# Patient Record
Sex: Female | Born: 2000 | Race: White | Hispanic: No | Marital: Single | Smoking: Never smoker
Health system: Southern US, Community
[De-identification: ages and names within clinical notes are randomized; demographics above are authoritative.]

## PROBLEM LIST (undated history)

## (undated) HISTORY — PX: NO PAST SURGERIES: SHX2092

---
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
## 2023-12-10 ENCOUNTER — Ambulatory Visit
Admission: EM | Admit: 2023-12-10 | Discharge: 2023-12-10 | Disposition: A | Discharge status: Home / Self Care | Payer: BC Managed Care – PPO | Attending: Family Medicine | Admitting: Family Medicine

## 2023-12-10 DIAGNOSIS — J101 Influenza due to other identified influenza virus with other respiratory manifestations: Secondary | ICD-10-CM | POA: Insufficient documentation

## 2023-12-10 LAB — GROUP A STREP BY PCR: Group A Strep by PCR: NOT DETECTED

## 2023-12-10 LAB — RESP PANEL BY RT-PCR (FLU A&B, COVID) ARPGX2
Influenza A by PCR: POSITIVE — AB
Influenza B by PCR: NEGATIVE
SARS Coronavirus 2 by RT PCR: NEGATIVE

## 2023-12-10 MED ORDER — PROMETHAZINE-DM 6.25-15 MG/5ML PO SYRP: 5.0000 mL | ORAL_SOLUTION | Freq: Four times a day (QID) | ORAL | 0 refills | Status: AC | PRN

## 2023-12-10 MED ORDER — OSELTAMIVIR PHOSPHATE 75 MG PO CAPS: 75.0000 mg | ORAL_CAPSULE | Freq: Two times a day (BID) | ORAL | 0 refills | Status: AC

## 2023-12-10 NOTE — ED Triage Notes (Signed)
Pt c/o cough,sore throat,bodyaches & chills x2 days. Has tried advil w/o relief.

## 2023-12-10 NOTE — ED Provider Notes (Signed)
[facility]    CSN: [id number] Arrival date & time: 12/10/23  1335      History   Chief Complaint Chief Complaint  Patient presents with   Cough   Sore Throat   Generalized Body Aches   Chills    HPI Tonya Orr is a 23 y.o. female.   HPI  History obtained from the patient and her dad . Tonya Orr presents for fever, body aches, sore throat, headache, chills, watery eyes and sneezing that started 2 days ago.  No abdominal pain, vomiting or diarrhea. Took some Advil prior to arrival.  No known sick contacts. Dad says he and her brother are well.  He believes she picked it up at her college.       History reviewed. No pertinent past medical history.  There are no active problems to display for this patient.   Past Surgical History:  Procedure Laterality Date   NO PAST SURGERIES     NO PAST SURGERIES      OB History   No obstetric history on file.      Home Medications    Prior to Admission medications   Medication Sig Start Date End Date Taking? Authorizing Provider  oseltamivir (TAMIFLU) 75 MG capsule Take 1 capsule (75 mg total) by mouth every 12 (twelve) hours. 12/10/23  Yes Theda Payer, DO  promethazine-dextromethorphan (PROMETHAZINE-DM) 6.25-15 MG/5ML syrup Take 5 mLs by mouth 4 (four) times daily as needed. 12/10/23  Yes Sujay Grundman, DO  sertraline (ZOLOFT) 50 MG tablet Take 50 mg by mouth daily. 10/22/23  Yes [provider]    Family History History reviewed. No pertinent family history.  Social History Social History   Tobacco Use   Smoking status: Never   Smokeless tobacco: Never  Vaping Use   Vaping status: Never Used  Substance Use Topics   Alcohol use: Never   Drug use: Never     Allergies   Penicillins and Sulfa antibiotics   Review of Systems Review of Systems: negative unless otherwise stated in HPI.      Physical Exam Triage Vital Signs ED Triage Vitals  Encounter Vitals Group     BP  12/10/23 1430 117/78     Systolic BP Percentile --      Diastolic BP Percentile --      Pulse Rate 12/10/23 1430 (!) 101     Resp 12/10/23 1430 16     Temp 12/10/23 1430 99.9 F (37.7 C)     Temp Source 12/10/23 1430 Oral     SpO2 12/10/23 1430 98 %     Weight --      Height 12/10/23 1428 5\' 4"  (1.626 m)     Head Circumference --      Peak Flow --      Pain Score 12/10/23 1436 7     Pain Loc --      Pain Education --      Exclude from Growth Chart --    No data found.  Updated Vital Signs BP 117/78 (BP Location: Right Arm)   Pulse (!) 101   Temp 99.9 F (37.7 C) (Oral)   Resp 16   Ht 5\' 4"  (1.626 m)   LMP 11/25/2023 (Exact Date)   SpO2 98%   BMI 20.60 kg/m   Visual Acuity Right Eye Distance:   Left Eye Distance:   Bilateral Distance:    Right Eye Near:   Left Eye Near:    Bilateral Near:  Physical Exam GEN:     alert, non-toxic appearing female in no distress    HENT:  mucus membranes moist, oropharyngeal without lesions or erythema, no tonsillar hypertrophy or exudates, clear nasal discharge, bilateral TM normal EYES:   pupils equal and reactive, no scleral injection or discharge NECK:  normal ROM, no lymphadenopathy, no meningismus   RESP:  no increased work of breathing, clear to auscultation bilaterally CVS:   regular rhythm, tachycardic  Skin:   warm and dry, no rash on visible skin    UC Treatments / Results  Labs (all labs ordered are listed, but only abnormal results are displayed) Labs Reviewed  RESP PANEL BY RT-PCR (FLU A&B, COVID) ARPGX2 - Abnormal; Notable for the following components:      Result Value   Influenza A by PCR POSITIVE (*)    All other components within normal limits  GROUP A STREP BY PCR    EKG   Radiology No results found.  Procedures Procedures (including critical care time)  Medications Ordered in UC Medications - No data to display  Initial Impression / Assessment and Plan / UC Course  I have reviewed the  triage vital signs and the nursing notes.  Pertinent labs & imaging results that were available during my care of the patient were reviewed by me and considered in my medical decision making (see chart for details).       Pt is a 23 y.o. female who presents for 2 days of respiratory symptoms. Dawnelle has an elevated temperature here of 99.9 F and is tachycardic.  Satting well on room air. Overall pt is non-toxic appearing, well hydrated, without respiratory distress. Pulmonary exam is unremarkable.  COVID and influenza panel obtained and was influenza A positive.  Risk and benefits of Tamiflu discussed and she would like [place] to proceed with prescription.  Tamiflu and Promethazine DM prescribed.  Discussed symptomatic treatment. Typical duration of symptoms discussed.  School and work note provided.  Return and ED precautions given and voiced understanding. Discussed MDM, treatment plan and plan for follow-up with patient and her dad who agree with plan.     Final Clinical Impressions(s) / UC Diagnoses   Final diagnoses:  Influenza A     Discharge Instructions      You have influenza A.  Tamiflu was prescribed.  Your symptoms will gradually improve over the next 7 to 10 days.  The cough may last about 3 weeks.   Take ibuprofen 400 mg with Tylenol 1000 mg for fever, headache or body aches.   For cough:  Stop by the the pharmacy to pick up your prescription cough medication. You can use a humidifier for chest congestion and cough.  If you don't have a humidifier, you can sit in the bathroom with the hot shower running.      For sore throat: try warm salt water gargles, Mucinex sore throat cough drops or cepacol lozenges, throat spray, warm tea or water with lemon/honey, popsicles or ice, or OTC cold relief medicine for throat discomfort. You can also purchase chloraseptic spray at the pharmacy or dollar store.   For congestion: take a daily anti-histamine like Zyrtec, Claritin, and a  oral decongestant, such as pseudoephedrine.  You can also use Flonase 1-2 sprays in each nostril daily. Afrin is also a good option, if you do not have high blood pressure.    It is important to stay hydrated: drink plenty of fluids (water, gatorade/powerade/pedialyte, juices, or teas) to keep your  throat moisturized and help further relieve irritation/discomfort.    Return or go to the Emergency Department if symptoms worsen or do not improve in the next few days     ED Prescriptions     Medication Sig Dispense Auth. Provider   oseltamivir (TAMIFLU) 75 MG capsule Take 1 capsule (75 mg total) by mouth every 12 (twelve) hours. 10 capsule Anh Mangano, DO   promethazine-dextromethorphan (PROMETHAZINE-DM) 6.25-15 MG/5ML syrup Take 5 mLs by mouth 4 (four) times daily as needed. 118 mL Katha Cabal, DO      PDMP not reviewed this encounter.   Katha Cabal, DO 12/10/23 1629

## 2023-12-10 NOTE — Discharge Instructions (Signed)
You have influenza A.  Tamiflu was prescribed.  Your symptoms will gradually improve over the next 7 to 10 days.  The cough may last about 3 weeks.   Take ibuprofen 400 mg with Tylenol 1000 mg for fever, headache or body aches.   For cough:  Stop by the the pharmacy to pick up your prescription cough medication. You can use a humidifier for chest congestion and cough.  If you don't have a humidifier, you can sit in the bathroom with the hot shower running.      For sore throat: try warm salt water gargles, Mucinex sore throat cough drops or cepacol lozenges, throat spray, warm tea or water with lemon/honey, popsicles or ice, or OTC cold relief medicine for throat discomfort. You can also purchase chloraseptic spray at the pharmacy or dollar store.   For congestion: take a daily anti-histamine like Zyrtec, Claritin, and a oral decongestant, such as pseudoephedrine.  You can also use Flonase 1-2 sprays in each nostril daily. Afrin is also a good option, if you do not have high blood pressure.    It is important to stay hydrated: drink plenty of fluids (water, gatorade/powerade/pedialyte, juices, or teas) to keep your throat moisturized and help further relieve irritation/discomfort.    Return or go to the Emergency Department if symptoms worsen or do not improve in the next few days

## 2023-12-11 ENCOUNTER — Ambulatory Visit: Payer: Self-pay
